# Patient Record
Sex: Female | Born: 2000 | Hispanic: Yes | Marital: Single | Smoking: Never smoker
Health system: Southern US, Community
[De-identification: ages and names within clinical notes are randomized; demographics above are authoritative.]

## PROBLEM LIST (undated history)

## (undated) DIAGNOSIS — J45909 Unspecified asthma, uncomplicated: Secondary | ICD-10-CM

## (undated) HISTORY — PX: APPENDECTOMY: SHX54

## (undated) HISTORY — PX: TONSILLECTOMY: SUR1361

---
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
## 2021-09-12 ENCOUNTER — Other Ambulatory Visit: Payer: Self-pay | Admitting: Internal Medicine

## 2021-09-13 LAB — COMPLETE METABOLIC PANEL WITH GFR
AG Ratio: 1.6 (calc) (ref 1.0–2.5)
ALT: 31 U/L (ref 5–32)
AST: 20 U/L (ref 12–32)
Albumin: 4.4 g/dL (ref 3.6–5.1)
Alkaline phosphatase (APISO): 76 U/L (ref 36–128)
BUN: 10 mg/dL (ref 7–20)
CO2: 24 mmol/L (ref 20–32)
Calcium: 9.5 mg/dL (ref 8.9–10.4)
Chloride: 104 mmol/L (ref 98–110)
Creat: 0.58 mg/dL (ref 0.50–0.96)
Globulin: 2.7 g/dL (calc) (ref 2.0–3.8)
Glucose, Bld: 75 mg/dL (ref 65–99)
Potassium: 3.7 mmol/L — ABNORMAL LOW (ref 3.8–5.1)
Sodium: 139 mmol/L (ref 135–146)
Total Bilirubin: 0.4 mg/dL (ref 0.2–1.1)
Total Protein: 7.1 g/dL (ref 6.3–8.2)
eGFR: 134 mL/min/{1.73_m2} (ref >=60)

## 2021-09-13 LAB — CBC
HCT: 44.4 % (ref 35.0–45.0)
Hemoglobin: 15.3 g/dL (ref 11.7–15.5)
MCH: 29.3 pg (ref 27.0–33.0)
MCHC: 34.5 g/dL (ref 32.0–36.0)
MCV: 84.9 fL (ref 80.0–100.0)
MPV: 9 fL (ref 7.5–12.5)
Platelets: 364 10*3/uL (ref 140–400)
RBC: 5.23 10*6/uL — ABNORMAL HIGH (ref 3.80–5.10)
RDW: 12.7 % (ref 11.0–15.0)
WBC: 8.7 10*3/uL (ref 3.8–10.8)

## 2021-09-13 LAB — LIPID PANEL
Cholesterol: 153 mg/dL (ref <170)
HDL: 37 mg/dL — ABNORMAL LOW (ref >45)
LDL Cholesterol (Calc): 86 mg/dL (calc) (ref <110)
Non-HDL Cholesterol (Calc): 116 mg/dL (calc) (ref <120)
Total CHOL/HDL Ratio: 4.1 (calc) (ref <5.0)
Triglycerides: 199 mg/dL — ABNORMAL HIGH (ref <90)

## 2021-09-13 LAB — TSH: TSH: 2.65 mIU/L

## 2021-09-13 LAB — VITAMIN D 25 HYDROXY (VIT D DEFICIENCY, FRACTURES): Vit D, 25-Hydroxy: 16 ng/mL — ABNORMAL LOW (ref 30–100)

## 2022-11-15 ENCOUNTER — Other Ambulatory Visit: Payer: Self-pay | Admitting: Internal Medicine

## 2022-11-16 LAB — LIPID PANEL
Cholesterol: 159 mg/dL (ref <200)
HDL: 47 mg/dL — ABNORMAL LOW (ref >=50)
LDL Cholesterol (Calc): 94 mg/dL (calc)
Non-HDL Cholesterol (Calc): 112 mg/dL (calc) (ref <130)
Total CHOL/HDL Ratio: 3.4 (calc) (ref <5.0)
Triglycerides: 88 mg/dL (ref <150)

## 2022-11-16 LAB — COMPLETE METABOLIC PANEL WITH GFR
AG Ratio: 1.5 (calc) (ref 1.0–2.5)
ALT: 28 U/L (ref 6–29)
AST: 21 U/L (ref 10–30)
Albumin: 4.6 g/dL (ref 3.6–5.1)
Alkaline phosphatase (APISO): 70 U/L (ref 31–125)
BUN: 8 mg/dL (ref 7–25)
CO2: 25 mmol/L (ref 20–32)
Calcium: 9.8 mg/dL (ref 8.6–10.2)
Chloride: 102 mmol/L (ref 98–110)
Creat: 0.57 mg/dL (ref 0.50–0.96)
Globulin: 3.1 g/dL (calc) (ref 1.9–3.7)
Glucose, Bld: 88 mg/dL (ref 65–99)
Potassium: 4.3 mmol/L (ref 3.5–5.3)
Sodium: 137 mmol/L (ref 135–146)
Total Bilirubin: 0.5 mg/dL (ref 0.2–1.2)
Total Protein: 7.7 g/dL (ref 6.1–8.1)
eGFR: 133 mL/min/{1.73_m2} (ref >=60)

## 2022-11-16 LAB — CBC
HCT: 44.9 % (ref 35.0–45.0)
Hemoglobin: 15.9 g/dL — ABNORMAL HIGH (ref 11.7–15.5)
MCH: 29.7 pg (ref 27.0–33.0)
MCHC: 35.4 g/dL (ref 32.0–36.0)
MCV: 83.8 fL (ref 80.0–100.0)
MPV: 9 fL (ref 7.5–12.5)
Platelets: 381 10*3/uL (ref 140–400)
RBC: 5.36 10*6/uL — ABNORMAL HIGH (ref 3.80–5.10)
RDW: 12.2 % (ref 11.0–15.0)
WBC: 8.7 10*3/uL (ref 3.8–10.8)

## 2022-11-16 LAB — VITAMIN D 25 HYDROXY (VIT D DEFICIENCY, FRACTURES): Vit D, 25-Hydroxy: 11 ng/mL — ABNORMAL LOW (ref 30–100)

## 2023-09-18 ENCOUNTER — Encounter (HOSPITAL_COMMUNITY): Payer: Self-pay

## 2023-09-18 ENCOUNTER — Ambulatory Visit (HOSPITAL_COMMUNITY)
Admission: EM | Admit: 2023-09-18 | Discharge: 2023-09-18 | Disposition: A | Discharge status: Home / Self Care | Payer: Medicaid Other | Attending: Family Medicine | Admitting: Family Medicine

## 2023-09-18 DIAGNOSIS — R1030 Lower abdominal pain, unspecified: Secondary | ICD-10-CM | POA: Diagnosis not present

## 2023-09-18 DIAGNOSIS — K625 Hemorrhage of anus and rectum: Secondary | ICD-10-CM | POA: Diagnosis not present

## 2023-09-18 LAB — CBC
HCT: 42.2 % (ref 36.0–46.0)
Hemoglobin: 15 g/dL (ref 12.0–15.0)
MCH: 29.4 pg (ref 26.0–34.0)
MCHC: 35.5 g/dL (ref 30.0–36.0)
MCV: 82.7 fL (ref 80.0–100.0)
Platelets: 327 10*3/uL (ref 150–400)
RBC: 5.1 MIL/uL (ref 3.87–5.11)
RDW: 11.9 % (ref 11.5–15.5)
WBC: 7.1 10*3/uL (ref 4.0–10.5)
nRBC: 0 % (ref 0.0–0.2)

## 2023-09-18 LAB — COMPREHENSIVE METABOLIC PANEL
ALT: 21 U/L (ref 0–44)
AST: 21 U/L (ref 15–41)
Albumin: 4 g/dL (ref 3.5–5.0)
Alkaline Phosphatase: 89 U/L (ref 38–126)
Anion gap: 5 (ref 5–15)
BUN: 7 mg/dL (ref 6–20)
CO2: 26 mmol/L (ref 22–32)
Calcium: 9.2 mg/dL (ref 8.9–10.3)
Chloride: 104 mmol/L (ref 98–111)
Creatinine, Ser: 0.68 mg/dL (ref 0.44–1.00)
GFR, Estimated: >60 mL/min (ref >60)
Glucose, Bld: 87 mg/dL (ref 70–99)
Potassium: 3.6 mmol/L (ref 3.5–5.1)
Sodium: 135 mmol/L (ref 135–145)
Total Bilirubin: 0.7 mg/dL (ref 0.3–1.2)
Total Protein: 7.6 g/dL (ref 6.5–8.1)

## 2023-09-18 NOTE — Discharge Instructions (Signed)
We have drawn blood to check your blood counts, kidney liver function, and sugar and electrolytes.  Staff will notify if anything is significantly abnormal   If you start feeling dizzy and the bleeding is more and faster, please go to the emergency room for urgent evaluation  Please at least follow-up with your primary care about this issue, to consider possible referral to gastroenterology

## 2023-09-18 NOTE — ED Provider Notes (Signed)
[facility]    CSN: [id number] Arrival date & time: 09/18/23  1619      History   Chief Complaint Chief Complaint  Patient presents with   Abdominal Pain    HPI Katie Mack is a 22 y.o. female.    Abdominal Pain Here for some lower abdominal pain that began in the last few days with some rectal bleeding. She states she has had upper abdominal pain for quite a while and then some lower abdominal pain for the last few days.  Also she has had some light pink color to her stools sometimes for all while, but in the last for 5 days she has had streaks of blood in her stool.  No fever or vomiting   She did have diarrhea this morning but has had some constipation in the past  She is seeing her primary care about upper abdominal pain and ultrasound just showed fatty liver   last menstrual cycle was October 8.  She mentions feeling some pressure around her rectum   Past Medical History:  Diagnosis Date   Asthma     Patient Active Problem List   Diagnosis Date Noted   GERD 02/02/2015   Vitamin D deficiency 07/15/2014   Wears glasses 07/15/2014   Allergic rhinitis 07/15/2014   Mild intermittent asthma 07/15/2014   History of self injurious behavior 03/02/2014   Adjustment disorder with depressed mood 03/02/2014   Pediatric body mass index (BMI) of 85th percentile to less than 95th percentile for age 97/04/2014    Past Surgical History:  Procedure Laterality Date   TONSILLECTOMY      OB History   No obstetric history on file.      Home Medications    Prior to Admission medications   Medication Sig Start Date End Date Taking? Authorizing Provider  cetirizine (ZYRTEC) 10 MG tablet Take 1 tablet (10 mg total) by mouth daily. 10/10/18  Yes Jonetta Osgood, MD  fluticasone (FLOVENT HFA) 110 MCG/ACT inhaler Inhale 2 puffs into the lungs 2 (two) times daily. 10/10/18  Yes Jonetta Osgood, MD  VENTOLIN HFA 108 [phone] Base) MCG/ACT inhaler SMARTSIG:2  Puff(s) By Mouth 4 Times Daily PRN 05/03/23  Yes [provider]  fluticasone (FLONASE) 50 MCG/ACT nasal spray Place 1 spray into both nostrils daily. 1 spray in each nostril every day 10/10/18   Jonetta Osgood, MD    Family History History reviewed. No pertinent family history.  Social History Social History   Tobacco Use   Smoking status: Never  Vaping Use   Vaping status: Former  Substance Use Topics   Alcohol use: Yes     Allergies   Patient has no known allergies.   Review of Systems Review of Systems  Gastrointestinal:  Positive for abdominal pain.     Physical Exam Triage Vital Signs ED Triage Vitals  Encounter Vitals Group     BP 09/18/23 1657 123/80     Systolic BP Percentile --      Diastolic BP Percentile --      Pulse Rate 09/18/23 1657 85     Resp 09/18/23 1657 18     Temp 09/18/23 1657 98.4 F (36.9 C)     Temp Source 09/18/23 1657 Oral     SpO2 09/18/23 1657 98 %     Weight --      Height --      Head Circumference --      Peak Flow --  Pain Score 09/18/23 1658 2     Pain Loc --      Pain Education --      Exclude from Growth Chart --    No data found.  Updated Vital Signs BP 123/80 (BP Location: Left Arm)   Pulse 85   Temp 98.4 F (36.9 C) (Oral)   Resp 18   LMP 09/04/2023 (Approximate)   SpO2 98%   Visual Acuity Right Eye Distance:   Left Eye Distance:   Bilateral Distance:    Right Eye Near:   Left Eye Near:    Bilateral Near:     Physical Exam Vitals reviewed.  Constitutional:      General: She is not in acute distress.    Appearance: She is not ill-appearing, toxic-appearing or diaphoretic.  HENT:     Mouth/Throat:     Mouth: Mucous membranes are moist.  Eyes:     Extraocular Movements: Extraocular movements intact.     Conjunctiva/sclera: Conjunctivae normal.     Pupils: Pupils are equal, round, and reactive to light.  Cardiovascular:     Rate and Rhythm: Normal rate and regular rhythm.     Heart  sounds: No murmur heard. Pulmonary:     Effort: Pulmonary effort is normal.     Breath sounds: Normal breath sounds.  Abdominal:     General: There is no distension.     Palpations: Abdomen is soft. There is no mass.     Tenderness: There is no guarding.     Comments: There is some tenderness of the upper right quadrant and epigastric area and in both lower quadrants.  Abdomen is soft and bowel sounds are normal  Genitourinary:    Comments: Chaperone is present during exam.  There are no external hemorrhoids or masses or redness around the rectum. Musculoskeletal:     Cervical back: Neck supple.  Lymphadenopathy:     Cervical: No cervical adenopathy.  Skin:    Coloration: Skin is not jaundiced or pale.  Neurological:     General: No focal deficit present.     Mental Status: She is alert and oriented to person, place, and time.  Psychiatric:        Behavior: Behavior normal.      UC Treatments / Results  Labs (all labs ordered are listed, but only abnormal results are displayed) Labs Reviewed  CBC  COMPREHENSIVE METABOLIC PANEL    EKG   Radiology No results found.  Procedures Procedures (including critical care time)  Medications Ordered in UC Medications - No data to display  Initial Impression / Assessment and Plan / UC Course  I have reviewed the triage vital signs and the nursing notes.  Pertinent labs & imaging results that were available during my care of the patient were reviewed by me and considered in my medical decision making (see chart for details).      CBC and CMP are drawn today.  Will notify her if anything is significantly abnormal.  I have encouraged her to follow-up with her primary care.  Final Clinical Impressions(s) / UC Diagnoses   Final diagnoses:  Rectal bleeding  Lower abdominal pain     Discharge Instructions      We have drawn blood to check your blood counts, kidney liver function, and sugar and electrolytes.  Staff will  notify if anything is significantly abnormal   If you start feeling dizzy and the bleeding is more and faster, please go to the emergency room for  urgent evaluation  Please at least follow-up with your primary care about this issue, to consider possible referral to gastroenterology    ED Prescriptions   None    PDMP not reviewed this encounter.   Zenia Resides, MD 09/18/23 [phone]

## 2023-09-18 NOTE — ED Triage Notes (Signed)
Pt presents with lower abdominal pain. Pt states " I have been pooping bright red blood for the last 4 days and I have been extremely nauseous." Pt's last BM was this morning, noted blood in the stool. Pt also reporting fatigue. Pt states she has not taken any medication for her symptoms.

## 2023-09-29 ENCOUNTER — Ambulatory Visit (HOSPITAL_COMMUNITY): Payer: Medicaid Other

## 2024-06-07 ENCOUNTER — Ambulatory Visit (HOSPITAL_COMMUNITY)

## 2024-07-24 ENCOUNTER — Encounter (HOSPITAL_COMMUNITY): Payer: Self-pay

## 2024-07-24 ENCOUNTER — Ambulatory Visit (HOSPITAL_COMMUNITY)
Admission: EM | Admit: 2024-07-24 | Discharge: 2024-07-24 | Disposition: A | Discharge status: Home / Self Care | Attending: Family Medicine | Admitting: Family Medicine

## 2024-07-24 DIAGNOSIS — R8281 Pyuria: Secondary | ICD-10-CM | POA: Diagnosis present

## 2024-07-24 DIAGNOSIS — N898 Other specified noninflammatory disorders of vagina: Secondary | ICD-10-CM | POA: Insufficient documentation

## 2024-07-24 DIAGNOSIS — Z113 Encounter for screening for infections with a predominantly sexual mode of transmission: Secondary | ICD-10-CM | POA: Insufficient documentation

## 2024-07-24 LAB — HIV ANTIBODY (ROUTINE TESTING W REFLEX): HIV Screen 4th Generation wRfx: NONREACTIVE

## 2024-07-24 LAB — POCT URINALYSIS DIP (MANUAL ENTRY)
Blood, UA: NEGATIVE
Glucose, UA: NEGATIVE mg/dL
Ketones, POC UA: NEGATIVE mg/dL
Nitrite, UA: NEGATIVE
Protein Ur, POC: NEGATIVE mg/dL
Spec Grav, UA: 1.025 (ref 1.010–1.025)
Urobilinogen, UA: 1 U/dL
pH, UA: 6.5 (ref 5.0–8.0)

## 2024-07-24 LAB — POCT URINE PREGNANCY: Preg Test, Ur: NEGATIVE

## 2024-07-24 MED ORDER — FLUCONAZOLE 150 MG PO TABS
150.0000 mg | ORAL_TABLET | ORAL | 0 refills | Status: AC
Start: 2024-07-24 — End: 2024-07-28

## 2024-07-24 MED ORDER — METRONIDAZOLE 500 MG PO TABS: 500.0000 mg | ORAL_TABLET | Freq: Two times a day (BID) | ORAL | 0 refills | Status: AC

## 2024-07-24 NOTE — Discharge Instructions (Addendum)
 Testing for bacteria, yeast, gonorrhea, chlamydia, trichomonas, HIV and syphilis is currently pending.  A urine culture has also been sent to identify if you have any bacteria in your urine which could be causing your symptoms as well. It is important that you avoid any sexual activity until your test results have returned, your treatment is complete, and your symptoms have fully resolved.  Because you are experiencing symptoms today, you have been prescribed oral antibiotics to treat a possible bacterial vaginal infection and an antifungal medication for possible yeast infection. It is very important to take all of the medication exactly as prescribed, even if you begin to feel better before finishing the course.  You will only be contacted if any of your test results come back positive. However, you can view your results at any time by logging into your MyChart account.

## 2024-07-24 NOTE — ED Triage Notes (Addendum)
 Patient is requesting STD testing. Patient states she is having abdominal cramping and having a white  vaginal discharge x 1 1/2 weeks. Patient is also requesting blood work as well.

## 2024-07-24 NOTE — ED Provider Notes (Signed)
 [facility]    CSN: [id number] Arrival date & time: 07/24/24  1102      History   Chief Complaint No chief complaint on file.   HPI Katie Mack is a 23 y.o. female.   Discussed the use of AI scribe software for clinical note transcription with the patient, who gave verbal consent to proceed.   The patient presents for STD testing. She reports a history of chlamydia several years ago and expresses concern about her current symptoms.  The patient describes experiencing white vaginal discharge, which she is unsure if it's normal. She states the discharge does not have a strong odor. Additionally, she reports cramping in her lower abdomen, which she typically experiences before her menstrual period. The patient denies any burning or pain during urination, itching or irritation of the vagina, genital sores or bumps, or oral sores. She also denies nausea, vomiting, back pain, or fevers. The patient's last menstrual period was approximately 5 weeks ago. She is currently not on birth control and is sexually active. She reports having two female sexual partners in the past 3 months and sometimes uses condoms during intercourse.  The following portions of the patient's history were reviewed and updated as appropriate: allergies, current medications, past family history, past medical history, past social history, past surgical history, and problem list.    Past Medical History:  Diagnosis Date   Asthma     Patient Active Problem List   Diagnosis Date Noted   GERD 02/02/2015   Vitamin D  deficiency 07/15/2014   Wears glasses 07/15/2014   Allergic rhinitis 07/15/2014   Mild intermittent asthma 07/15/2014   History of self injurious behavior 03/02/2014   Adjustment disorder with depressed mood 03/02/2014   Pediatric body mass index (BMI) of 85th percentile to less than 95th percentile for age 33/04/2014    Past Surgical History:  Procedure Laterality Date    TONSILLECTOMY      OB History   No obstetric history on file.      Home Medications    Prior to Admission medications   Medication Sig Start Date End Date Taking? Authorizing Provider  fluconazole  (DIFLUCAN ) 150 MG tablet Take 1 tablet (150 mg total) by mouth every 3 (three) days for 2 doses. 07/24/24 07/28/24 Yes Iola Lukes, FNP  metroNIDAZOLE  (FLAGYL ) 500 MG tablet Take 1 tablet (500 mg total) by mouth 2 (two) times daily for 7 days. 07/24/24 07/31/24 Yes Iola Lukes, FNP  cetirizine  (ZYRTEC ) 10 MG tablet Take 1 tablet (10 mg total) by mouth daily. 10/10/18   Delores Clapper, MD  fluticasone  (FLONASE ) 50 MCG/ACT nasal spray Place 1 spray into both nostrils daily. 1 spray in each nostril every day 10/10/18   Delores Clapper, MD  fluticasone  (FLOVENT  HFA) 110 MCG/ACT inhaler Inhale 2 puffs into the lungs 2 (two) times daily. 10/10/18   Delores Clapper, MD  VENTOLIN  HFA 108 [phone] Base) MCG/ACT inhaler SMARTSIG:2 Puff(s) By Mouth 4 Times Daily PRN 05/03/23   [provider]    Family History History reviewed. No pertinent family history.  Social History Social History   Tobacco Use   Smoking status: Never  Vaping Use   Vaping status: Former  Substance Use Topics   Alcohol use: Yes   Drug use: Never     Allergies   Patient has no known allergies.   Review of Systems Review of Systems  Constitutional:  Negative for fever.  HENT:  Negative for mouth sores.   Gastrointestinal:  Negative  for abdominal pain (mild lower abdominal cramping), nausea and vomiting.  Genitourinary:  Positive for vaginal discharge (white, mild odor). Negative for dysuria, genital sores and menstrual problem (LMP about 5 weeks ago).       Mildly sensitive to vaginal area but no irritation or itching   Musculoskeletal:  Negative for back pain.  All other systems reviewed and are negative.    Physical Exam Triage Vital Signs ED Triage Vitals  Encounter Vitals Group     BP 07/24/24 1118  124/84     Girls Systolic BP Percentile --      Girls Diastolic BP Percentile --      Boys Systolic BP Percentile --      Boys Diastolic BP Percentile --      Pulse Rate 07/24/24 1118 75     Resp 07/24/24 1118 14     Temp 07/24/24 1118 98.6 F (37 C)     Temp Source 07/24/24 1118 Oral     SpO2 07/24/24 1118 100 %     Weight --      Height --      Head Circumference --      Peak Flow --      Pain Score 07/24/24 1117 3     Pain Loc --      Pain Education --      Exclude from Growth Chart --    No data found.  Updated Vital Signs BP 124/84 (BP Location: Right Arm)   Pulse 75   Temp 98.6 F (37 C) (Oral)   Resp 14   LMP 06/27/2024 (Approximate)   SpO2 100%   Visual Acuity Right Eye Distance:   Left Eye Distance:   Bilateral Distance:    Right Eye Near:   Left Eye Near:    Bilateral Near:     Physical Exam Constitutional:      General: She is not in acute distress.    Appearance: Normal appearance. She is not ill-appearing, toxic-appearing or diaphoretic.  HENT:     Head: Normocephalic.     Nose: Nose normal.     Mouth/Throat:     Mouth: Mucous membranes are moist.  Eyes:     Conjunctiva/sclera: Conjunctivae normal.  Cardiovascular:     Rate and Rhythm: Normal rate.  Pulmonary:     Effort: Pulmonary effort is normal.  Abdominal:     Palpations: Abdomen is soft.  Genitourinary:    Comments: Deferred; patient performed self-swab for Aptima testing  Musculoskeletal:        General: Normal range of motion.     Cervical back: Normal range of motion and neck supple.  Skin:    General: Skin is warm and dry.  Neurological:     General: No focal deficit present.     Mental Status: She is alert and oriented to person, place, and time.  Psychiatric:        Mood and Affect: Mood normal.        Behavior: Behavior normal.      UC Treatments / Results  Labs (all labs ordered are listed, but only abnormal results are displayed) Labs Reviewed  POCT URINALYSIS  DIP (MANUAL ENTRY) - Abnormal; Notable for the following components:      Result Value   Color, UA straw (*)    Clarity, UA cloudy (*)    Bilirubin, UA small (*)    Leukocytes, UA Trace (*)    All other components within normal limits  URINE CULTURE  RPR  HIV ANTIBODY (ROUTINE TESTING W REFLEX)  POCT URINE PREGNANCY  CERVICOVAGINAL ANCILLARY ONLY    EKG   Radiology No results found.  Procedures Procedures (including critical care time)  Medications Ordered in UC Medications - No data to display  Initial Impression / Assessment and Plan / UC Course  I have reviewed the triage vital signs and the nursing notes.  Pertinent labs & imaging results that were available during my care of the patient were reviewed by me and considered in my medical decision making (see chart for details).     Patient presents for STI testing due to symptoms of lower abdominal cramping and vaginal discharge.  Urinalysis shows straw-colored urine that is cloudy with trace leukocytes.  She denies any dysuria.  Urine pregnancy negative.  Tests obtained today include gonorrhea, chlamydia, trichomonas, HIV and syphilis.  Urine culture was also sent out.  Will await results to determine if any treatment is needed. Patient was counseled to abstain from sexual activity until all results have been received, any necessary treatment has been completed, and any symptoms, if present, have resolved. Safe sex practices were discussed and encouraged, including consistent condom use and regular screening with new partners. Patient advised they will only be contacted if any results are positive or require follow-up; otherwise, they may review their results through MyChart.  Today's evaluation has revealed no signs of a dangerous process. Discussed diagnosis with patient and/or guardian. Patient and/or guardian aware of their diagnosis, possible red flag symptoms to watch out for and need for close follow up. Patient and/or  guardian understands verbal and written discharge instructions. Patient and/or guardian comfortable with plan and disposition.  Patient and/or guardian has a clear mental status at this time, good insight into illness (after discussion and teaching) and has clear judgment to make decisions regarding their care  Documentation was completed with the aid of voice recognition software. Transcription may contain typographical errors. Final Clinical Impressions(s) / UC Diagnoses   Final diagnoses:  Vaginal discharge  Pyuria  Screening for STD (sexually transmitted disease)     Discharge Instructions      Testing for bacteria, yeast, gonorrhea, chlamydia, trichomonas, HIV and syphilis is currently pending.  A urine culture has also been sent to identify if you have any bacteria in your urine which could be causing your symptoms as well. It is important that you avoid any sexual activity until your test results have returned, your treatment is complete, and your symptoms have fully resolved.  Because you are experiencing symptoms today, you have been prescribed oral antibiotics to treat a possible bacterial vaginal infection and an antifungal medication for possible yeast infection. It is very important to take all of the medication exactly as prescribed, even if you begin to feel better before finishing the course.  You will only be contacted if any of your test results come back positive. However, you can view your results at any time by logging into your MyChart account.     ED Prescriptions     Medication Sig Dispense Auth. Provider   metroNIDAZOLE  (FLAGYL ) 500 MG tablet Take 1 tablet (500 mg total) by mouth 2 (two) times daily for 7 days. 14 tablet Aydee Mcnew, [place], FNP   fluconazole  (DIFLUCAN ) 150 MG tablet Take 1 tablet (150 mg total) by mouth every 3 (three) days for 2 doses. 2 tablet Iola Lukes, FNP      PDMP not reviewed this encounter.   Iola Lukes, [place] 07/24/24  1228

## 2024-07-25 ENCOUNTER — Ambulatory Visit (HOSPITAL_COMMUNITY): Payer: Self-pay

## 2024-07-25 LAB — URINE CULTURE

## 2024-07-25 LAB — CERVICOVAGINAL ANCILLARY ONLY
Bacterial Vaginitis (gardnerella): POSITIVE — AB
Candida Glabrata: NEGATIVE
Candida Vaginitis: NEGATIVE
Chlamydia: NEGATIVE
Comment: NEGATIVE
Comment: NEGATIVE
Comment: NEGATIVE
Comment: NEGATIVE
Comment: NEGATIVE
Comment: NORMAL
Neisseria Gonorrhea: NEGATIVE
Trichomonas: NEGATIVE

## 2024-07-25 LAB — RPR: RPR Ser Ql: NONREACTIVE

## 2024-10-01 ENCOUNTER — Encounter (HOSPITAL_COMMUNITY): Payer: Self-pay | Admitting: Emergency Medicine

## 2024-10-01 ENCOUNTER — Ambulatory Visit (HOSPITAL_COMMUNITY)

## 2024-10-01 ENCOUNTER — Ambulatory Visit (HOSPITAL_COMMUNITY)
Admission: EM | Admit: 2024-10-01 | Discharge: 2024-10-01 | Disposition: A | Discharge status: Home / Self Care | Attending: Physician Assistant | Admitting: Physician Assistant

## 2024-10-01 DIAGNOSIS — L292 Pruritus vulvae: Secondary | ICD-10-CM | POA: Insufficient documentation

## 2024-10-01 MED ORDER — FLUCONAZOLE 150 MG PO TABS: 150.0000 mg | ORAL_TABLET | ORAL | 0 refills | Status: DC | PRN

## 2024-10-01 NOTE — ED Triage Notes (Signed)
 Pt reports that since Sunday/Monday had vaginal itching. Denies any discharge or bumps, sores, lesions. Denies any known exposure to STD. Reports started her menstrual cycle today.

## 2024-10-01 NOTE — Discharge Instructions (Addendum)
 VISIT SUMMARY:  You came in today because you have been experiencing itching in your genital area since Monday. You mentioned that this itching started around the same time as your menstrual period, but you have not noticed any changes in your vaginal discharge or other symptoms like pain during urination, fever, or rashes. You took a Diflucan  pill on Monday, which you had leftover from a previous treatment for a yeast infection.  YOUR PLAN:  -VULVOVAGINAL CANDIDIASIS (YEAST INFECTION): A yeast infection is caused by an overgrowth of yeast in the vaginal area, leading to itching and discomfort. You have been prescribed Diflucan  (fluconazole ) 150 mg to be taken orally every 72 hours for two doses. We have also taken a swab test to confirm the diagnosis and check for other possible infections. The results of the swab test will be available in 1-3 days, and we will call you if the results are positive.  INSTRUCTIONS:  Please take the prescribed Diflucan  as directed. Await the results of the swab test, which will be communicated to you within 1-3 days if positive. If your symptoms persist or worsen, please contact our office.

## 2024-10-01 NOTE — ED Provider Notes (Signed)
 [facility]    CSN: [id number] Arrival date & time: 10/01/24  9060      History   Chief Complaint Chief Complaint  Patient presents with   appt 930    HPI Katie Mack is a 23 y.o. female.  has a past medical history of Asthma.   HPI  Discussed the use of AI scribe software for clinical note transcription with the patient, who gave verbal consent to proceed.  The patient presents with genital itching.  She has been experiencing itching in the genital area since Monday, which she describes as unusual. No changes in vaginal discharge, but she notes the onset of her menstrual period, associated with her typical cramping and pain.  No pain during urination, fever, chills, sores, rashes, vaginal pain, pelvic pain, nausea, vomiting, or diarrhea. She has not experienced these symptoms previously in the past.   She took a leftover Diflucan  pill on Monday, which she had from a previous episode when she was treated for bacterial vaginosis and also received medication for a yeast infection.   Past Medical History:  Diagnosis Date   Asthma     Patient Active Problem List   Diagnosis Date Noted   GERD 02/02/2015   Vitamin D  deficiency 07/15/2014   Wears glasses 07/15/2014   Allergic rhinitis 07/15/2014   Mild intermittent asthma 07/15/2014   History of self injurious behavior 03/02/2014   Adjustment disorder with depressed mood 03/02/2014   Pediatric body mass index (BMI) of 85th percentile to less than 95th percentile for age 16/04/2014    Past Surgical History:  Procedure Laterality Date   TONSILLECTOMY      OB History   No obstetric history on file.      Home Medications    Prior to Admission medications   Medication Sig Start Date End Date Taking? Authorizing Provider  fluconazole  (DIFLUCAN ) 150 MG tablet Take 1 tablet (150 mg total) by mouth every three (3) days as needed. May repeat in 3 days if symptoms not resolved 10/01/24  Yes Kellianne Ek,  [place] Hawker E, PA-C    Family History No family history on file.  Social History Social History   Tobacco Use   Smoking status: Never  Vaping Use   Vaping status: Former  Substance Use Topics   Alcohol use: Yes   Drug use: Never     Allergies   Patient has no known allergies.   Review of Systems Review of Systems  Constitutional:  Negative for chills and fever.  Gastrointestinal:  Positive for abdominal pain (attributed to menstrual cramping). Negative for diarrhea, nausea and vomiting.  Genitourinary:  Negative for dysuria, genital sores, menstrual problem, pelvic pain and vaginal pain.       Vulvovaginal itching and discomfort   Skin:  Negative for rash.     Physical Exam Triage Vital Signs ED Triage Vitals  Encounter Vitals Group     BP 10/01/24 0959 115/65     Girls Systolic BP Percentile --      Girls Diastolic BP Percentile --      Boys Systolic BP Percentile --      Boys Diastolic BP Percentile --      Pulse Rate 10/01/24 0959 78     Resp 10/01/24 0959 17     Temp 10/01/24 0959 97.9 F (36.6 C)     Temp Source 10/01/24 0959 Oral     SpO2 10/01/24 0959 97 %     Weight --  Height --      Head Circumference --      Peak Flow --      Pain Score 10/01/24 0958 0     Pain Loc --      Pain Education --      Exclude from Growth Chart --    No data found.  Updated Vital Signs BP 115/65 (BP Location: Right Arm)   Pulse 78   Temp 97.9 F (36.6 C) (Oral)   Resp 17   LMP 10/01/2024 (Exact Date)   SpO2 97%   Visual Acuity Right Eye Distance:   Left Eye Distance:   Bilateral Distance:    Right Eye Near:   Left Eye Near:    Bilateral Near:     Physical Exam Vitals reviewed.  Constitutional:      General: She is awake.     Appearance: Normal appearance. She is well-developed and well-groomed.  HENT:     Head: Normocephalic and atraumatic.  Eyes:     General: Lids are normal. Gaze aligned appropriately.     Extraocular Movements: Extraocular  movements intact.     Conjunctiva/sclera: Conjunctivae normal.  Pulmonary:     Effort: Pulmonary effort is normal.  Neurological:     Mental Status: She is alert and oriented to person, place, and time.  Psychiatric:        Attention and Perception: Attention and perception normal.        Mood and Affect: Mood and affect normal.        Speech: Speech normal.        Behavior: Behavior normal. Behavior is cooperative.      UC Treatments / Results  Labs (all labs ordered are listed, but only abnormal results are displayed) Labs Reviewed  CERVICOVAGINAL ANCILLARY ONLY    EKG   Radiology No results found.  Procedures Procedures (including critical care time)  Medications Ordered in UC Medications - No data to display  Initial Impression / Assessment and Plan / UC Course  I have reviewed the triage vital signs and the nursing notes.  Pertinent labs & imaging results that were available during my care of the patient were reviewed by me and considered in my medical decision making (see chart for details).      Final Clinical Impressions(s) / UC Diagnoses   Final diagnoses:  Vulvovaginal itching   Vulvovaginal candidiasis (yeast infection) Intermittent genital itching since Monday, localized to the genital area. No vaginal discharge changes, lower abdominal pain, dysuria, fever, chills, sores, rashes, vaginal pain, pelvic pain, nausea, vomiting, or diarrhea. No similar symptoms previously. Differential diagnosis includes bacterial vaginosis, trichomonas, gonorrhea, and chlamydia. Swab test performed to check for yeast, bacterial vaginosis, trichomonas, gonorrhea, and chlamydia. Blood work for HIV and syphilis not desired as recent tests were done. Previous yeast infection prophylaxis with Diflucan  when treated for BV. Current symptoms suggestive of yeast infection. - Prescribed Diflucan  (fluconazole ) 150 mg orally every 72 hours for two doses. - Await swab test results to  confirm diagnosis and adjust treatment if necessary. - Informed that swab results may take 1-3 days and positive results will be communicated via phone call.    Discharge Instructions      VISIT SUMMARY:  You came in today because you have been experiencing itching in your genital area since Monday. You mentioned that this itching started around the same time as your menstrual period, but you have not noticed any changes in your vaginal discharge or other symptoms like  pain during urination, fever, or rashes. You took a Diflucan  pill on Monday, which you had leftover from a previous treatment for a yeast infection.  YOUR PLAN:  -VULVOVAGINAL CANDIDIASIS (YEAST INFECTION): A yeast infection is caused by an overgrowth of yeast in the vaginal area, leading to itching and discomfort. You have been prescribed Diflucan  (fluconazole ) 150 mg to be taken orally every 72 hours for two doses. We have also taken a swab test to confirm the diagnosis and check for other possible infections. The results of the swab test will be available in 1-3 days, and we will call you if the results are positive.  INSTRUCTIONS:  Please take the prescribed Diflucan  as directed. Await the results of the swab test, which will be communicated to you within 1-3 days if positive. If your symptoms persist or worsen, please contact our office.     ED Prescriptions     Medication Sig Dispense Auth. Provider   fluconazole  (DIFLUCAN ) 150 MG tablet Take 1 tablet (150 mg total) by mouth every three (3) days as needed. May repeat in 3 days if symptoms not resolved 2 tablet Yeimy Brabant E, PA-C      PDMP not reviewed this encounter.   Marylene Rocky BRAVO, PA-C 10/01/24 1035

## 2024-10-02 LAB — CERVICOVAGINAL ANCILLARY ONLY
Bacterial Vaginitis (gardnerella): NEGATIVE
Candida Glabrata: NEGATIVE
Candida Vaginitis: NEGATIVE
Chlamydia: POSITIVE — AB
Comment: NEGATIVE
Comment: NEGATIVE
Comment: NEGATIVE
Comment: NEGATIVE
Comment: NEGATIVE
Comment: NORMAL
Neisseria Gonorrhea: NEGATIVE
Trichomonas: NEGATIVE

## 2024-10-03 ENCOUNTER — Ambulatory Visit (HOSPITAL_COMMUNITY): Payer: Self-pay

## 2024-10-03 MED ORDER — DOXYCYCLINE HYCLATE 100 MG PO TABS: 100.0000 mg | ORAL_TABLET | Freq: Two times a day (BID) | ORAL | 0 refills | Status: AC

## 2024-10-21 ENCOUNTER — Telehealth: Admitting: Physician Assistant

## 2024-10-21 DIAGNOSIS — R197 Diarrhea, unspecified: Secondary | ICD-10-CM

## 2024-10-22 NOTE — Progress Notes (Signed)
  Because of how long symptoms have been present and need for examination and possible testing, I feel your condition warrants further evaluation and I recommend that you be seen in a face-to-face visit.   NOTE: There will be NO CHARGE for this E-Visit   If you are having a true medical emergency, please call 911.     For an urgent face to face visit, [facility] has multiple urgent care centers for your convenience.  Click the link below for the full list of locations and hours, walk-in wait times, appointment scheduling options and driving directions:  Urgent Care - [place], [place], [place], [place], [place], [place]  [place]     Your MyChart E-visit questionnaire answers were reviewed by a board certified advanced clinical practitioner to complete your personal care plan based on your specific symptoms.    Thank you for using e-Visits.

## 2024-10-28 ENCOUNTER — Other Ambulatory Visit: Payer: Self-pay

## 2024-10-28 ENCOUNTER — Ambulatory Visit: Admitting: Obstetrics and Gynecology

## 2024-10-28 ENCOUNTER — Encounter: Payer: Self-pay | Admitting: Obstetrics and Gynecology

## 2024-10-28 ENCOUNTER — Other Ambulatory Visit (HOSPITAL_COMMUNITY)
Admission: RE | Admit: 2024-10-28 | Discharge: 2024-10-28 | Disposition: A | Discharge status: Home / Self Care | Source: Ambulatory Visit | Attending: Obstetrics and Gynecology | Admitting: Obstetrics and Gynecology

## 2024-10-28 VITALS — BP 111/68 | HR 86 | Wt 174.4 lb

## 2024-10-28 DIAGNOSIS — N939 Abnormal uterine and vaginal bleeding, unspecified: Secondary | ICD-10-CM | POA: Diagnosis not present

## 2024-10-28 DIAGNOSIS — Z01419 Encounter for gynecological examination (general) (routine) without abnormal findings: Secondary | ICD-10-CM | POA: Diagnosis not present

## 2024-10-28 DIAGNOSIS — Z124 Encounter for screening for malignant neoplasm of cervix: Secondary | ICD-10-CM

## 2024-10-28 DIAGNOSIS — Z1239 Encounter for other screening for malignant neoplasm of breast: Secondary | ICD-10-CM

## 2024-10-28 LAB — POCT PREGNANCY, URINE: Preg Test, Ur: NEGATIVE

## 2024-10-28 NOTE — Progress Notes (Signed)
 GYNECOLOGY ANNUAL PREVENTATIVE CARE ENCOUNTER NOTE  Subjective:   Katie Mack is a 23 y.o. G0P0000 female here for a annual gynecologic exam. Current complaints: needs pap, irregular periods.    Reports a week before her period stars, she gets strong cramps. Sometimes starts as spotting, sometimes heavy, heavy for a few days, then lightens up, lasts about 7 days. Cycles last 4-6 weeks. More often it is 5-6 weeks long than 4 weeks.   Denies abnormal vaginal bleeding, discharge, pelvic pain, problems with intercourse or other gynecologic concerns. Declines STI screen, had done about a month ago.   Has had breast tenderness and feeling slightly different this month.  Positive for CT 09/29/24, treated.   Gynecologic History Patient's last menstrual period was 10/01/2024 (exact date). Contraception: none Last Pap: n/a Last mammogram: n/a Gardisil: has received  Obstetric History OB History  Gravida Para Term Preterm AB Living  0 0 0 0 0 0  SAB IAB Ectopic Multiple Live Births  0 0 0 0 0    Past Medical History:  Diagnosis Date   Asthma     Past Surgical History:  Procedure Laterality Date   APPENDECTOMY     TONSILLECTOMY      Current Outpatient Medications on File Prior to Visit  Medication Sig Dispense Refill   albuterol  (VENTOLIN  HFA) 108 (90 Base) MCG/ACT inhaler 1 puff as needed Inhalation every 4 hrs     fluconazole  (DIFLUCAN ) 150 MG tablet Take 1 tablet (150 mg total) by mouth every three (3) days as needed. May repeat in 3 days if symptoms not resolved (Patient not taking: Reported on 10/28/2024) 2 tablet 0   No current facility-administered medications on file prior to visit.    No Known Allergies  Social History   Socioeconomic History   Marital status: Single    Spouse name: Not on file   Number of children: Not on file   Years of education: Not on file   Highest education level: Not on file  Occupational History   Not on file  Tobacco  Use   Smoking status: Never   Smokeless tobacco: Not on file  Vaping Use   Vaping status: Former  Substance and Sexual Activity   Alcohol use: Yes   Drug use: Never   Sexual activity: Yes    Birth control/protection: None  Other Topics Concern   Not on file  Social History Narrative   Not on file   Social Drivers of Health   Financial Resource Strain: Not on file  Food Insecurity: Not on file  Transportation Needs: Not on file  Physical Activity: Not on file  Stress: Not on file  Social Connections: Not on file  Intimate Partner Violence: Not on file   History reviewed. No pertinent family history.  The following portions of the patient's history were reviewed and updated as appropriate: allergies, current medications, past family history, past medical history, past social history, past surgical history and problem list.  Review of Systems Pertinent items are noted in HPI.   Objective:  BP 111/68   Pulse 86   Wt 174 lb 6.4 oz (79.1 kg)   LMP 10/01/2024 (Exact Date)  CONSTITUTIONAL: Well-developed, well-nourished female in no acute distress.  HENT:  Normocephalic, atraumatic, External right and left ear normal. Oropharynx is clear and moist EYES: Conjunctivae and EOM are normal. Pupils are equal, round, and reactive to light. No scleral icterus.  NECK: Normal range of motion, supple, no masses.  Normal  thyroid.  SKIN: Skin is warm and dry. No rash noted. Not diaphoretic. No erythema. No pallor. NEUROLOGIC: Alert and oriented to person, place, and time. Normal reflexes, muscle tone coordination. No cranial nerve deficit noted. PSYCHIATRIC: Normal mood and affect. Normal behavior. Normal judgment and thought content. CARDIOVASCULAR: Normal heart rate noted RESPIRATORY: Effort normal, no problems with respiration noted. BREASTS: Symmetric in size. No masses, skin changes, nipple drainage, or lymphadenopathy. ABDOMEN: Soft, no distention noted.  No tenderness, rebound or  guarding.  PELVIC: Normal appearing external genitalia; normal appearing vaginal mucosa and cervix.  No abnormal discharge noted.  Pap smear obtained. Normal uterine size, no other palpable masses, no uterine or adnexal tenderness. MUSCULOSKELETAL: Normal range of motion. No tenderness.  No cyanosis, clubbing, or edema.   UPT:  Exam done with chaperone present.  Assessment and Plan:   1. Well woman exam (Primary) Normal female exam  2. Cervical cancer screening - Cytology - PAP( [facility])  3. Encounter for screening for malignant neoplasm of breast, unspecified screening modality Breast exam today  4. Abnormal uterine bleeding (AUB) Abnormally long cycles, though occurring regularly Has never had workup Was having unprotected intercourse for years with no pregnancy Will get basic labs and f/u for further workup - Hemoglobin A1c - Beta hCG quant (ref lab) - Follicle stimulating hormone - Prolactin - Testosterone,Free and Total - TSH Rfx on Abnormal to Free T4   Will follow up results of pap smear and manage accordingly. Encouraged improvement in diet and exercise.    Routine preventative health maintenance measures emphasized. Please refer to After Visit Summary for other counseling recommendations.     LOIS Yolanda Moats, MD, [facility] Attending Center for [employer] [facility])

## 2024-10-29 LAB — HEMOGLOBIN A1C
Est. average glucose Bld gHb Est-mCnc: 97 mg/dL
Hgb A1c MFr Bld: 5 % (ref 4.8–5.6)

## 2024-10-29 LAB — PROLACTIN: Prolactin: 20.1 ng/mL (ref 4.8–33.4)

## 2024-10-29 LAB — FOLLICLE STIMULATING HORMONE: FSH: 4.8 m[IU]/mL

## 2024-10-29 LAB — TESTOSTERONE,FREE AND TOTAL
Testosterone, Free: 4.4 pg/mL — ABNORMAL HIGH (ref 0.0–4.2)
Testosterone: 52 ng/dL (ref 13–71)

## 2024-10-29 LAB — TSH RFX ON ABNORMAL TO FREE T4: TSH: 3.42 u[IU]/mL (ref 0.450–4.500)

## 2024-10-29 LAB — CYTOLOGY - PAP: Diagnosis: NEGATIVE

## 2024-10-29 LAB — BETA HCG QUANT (REF LAB): hCG Quant: <1 m[IU]/mL

## 2024-10-30 ENCOUNTER — Ambulatory Visit: Payer: Self-pay | Admitting: Obstetrics and Gynecology

## 2024-12-02 ENCOUNTER — Ambulatory Visit (HOSPITAL_COMMUNITY)
Admission: RE | Admit: 2024-12-02 | Discharge: 2024-12-02 | Disposition: A | Discharge status: Home / Self Care | Source: Ambulatory Visit | Attending: Emergency Medicine | Admitting: Emergency Medicine

## 2024-12-02 ENCOUNTER — Encounter (HOSPITAL_COMMUNITY): Payer: Self-pay

## 2024-12-02 VITALS — BP 121/86 | HR 77 | Temp 98.2°F | Resp 16

## 2024-12-02 DIAGNOSIS — N760 Acute vaginitis: Secondary | ICD-10-CM | POA: Diagnosis not present

## 2024-12-02 LAB — POCT URINE PREGNANCY: Preg Test, Ur: NEGATIVE

## 2024-12-02 LAB — HIV ANTIBODY (ROUTINE TESTING W REFLEX): HIV Screen 4th Generation wRfx: REACTIVE — AB

## 2024-12-02 MED ORDER — FLUCONAZOLE 150 MG PO TABS: 150.0000 mg | ORAL_TABLET | Freq: Every day | ORAL | 0 refills | Status: AC

## 2024-12-02 NOTE — Discharge Instructions (Addendum)
 We are checking for sexually transmitted infections including yeast vaginitis and bacterial vaginosis.  You can take the Diflucan  to help with any vaginal itching.  We will contact you if we need to add or change anything in your treatment plan.  Abstain from intercourse until all results have been received.  Return to clinic for any new or urgent symptoms.

## 2024-12-02 NOTE — ED Triage Notes (Signed)
 Pt states that she has vaginal itching X 2 days. She would like STI testing she would like Cyto and blood work today.

## 2024-12-02 NOTE — ED Provider Notes (Signed)
 " [facility]    CSN: [id number] Arrival date & time: 12/02/24  9057      History   Chief Complaint Chief Complaint  Patient presents with   Vaginal Itching    Entered by patient   SEXUALLY TRANSMITTED DISEASE    HPI Katie Mack is a 24 y.o. female.   Patient presents to clinic requesting screening for sexually transmitted infection.  Tested positive for chlamydia in November and completed all antibiotics, partner tested positive for chlamydia as well and they abstain from intercourse until antibiotics were completed No vaginal discharge, but does feel uncomfortable in the vaginal area with some itching Denies abdominal pain or urinary symptoms  Would like HIV and syphilis screening  The history is provided by the patient and medical records.  Vaginal Itching    Past Medical History:  Diagnosis Date   Asthma     Patient Active Problem List   Diagnosis Date Noted   GERD 02/02/2015   Vitamin D  deficiency 07/15/2014   Wears glasses 07/15/2014   Allergic rhinitis 07/15/2014   Mild intermittent asthma 07/15/2014   History of self injurious behavior 03/02/2014   Adjustment disorder with depressed mood 03/02/2014   Pediatric body mass index (BMI) of 85th percentile to less than 95th percentile for age 21/04/2014    Past Surgical History:  Procedure Laterality Date   APPENDECTOMY     TONSILLECTOMY      OB History     Gravida  0   Para  0   Term  0   Preterm  0   AB  0   Living  0      SAB  0   IAB  0   Ectopic  0   Multiple  0   Live Births  0            Home Medications    Prior to Admission medications  Medication Sig Start Date End Date Taking? Authorizing Provider  fluconazole  (DIFLUCAN ) 150 MG tablet Take 1 tablet (150 mg total) by mouth daily. 12/02/24  Yes Laneah Luft  N, FNP  albuterol  (VENTOLIN  HFA) 108 (90 Base) MCG/ACT inhaler 1 puff as needed Inhalation every 4 hrs    [provider]     Family History History reviewed. No pertinent family history.  Social History Social History[1]   Allergies   Patient has no known allergies.   Review of Systems Review of Systems  Per HPI  Physical Exam Triage Vital Signs ED Triage Vitals [12/02/24 1006]  Encounter Vitals Group     BP 121/86     Girls Systolic BP Percentile      Girls Diastolic BP Percentile      Boys Systolic BP Percentile      Boys Diastolic BP Percentile      Pulse Rate 77     Resp 16     Temp 98.2 F (36.8 C)     Temp Source Oral     SpO2 97 %     Weight      Height      Head Circumference      Peak Flow      Pain Score 0     Pain Loc      Pain Education      Exclude from Growth Chart    No data found.  Updated Vital Signs BP 121/86 (BP Location: Right Arm)   Pulse 77   Temp 98.2 F (36.8 C) (Oral)  Resp 16   LMP  (LMP Unknown) Comment: 10/2024 not sure of date.  SpO2 97%   Visual Acuity Right Eye Distance:   Left Eye Distance:   Bilateral Distance:    Right Eye Near:   Left Eye Near:    Bilateral Near:     Physical Exam Vitals and nursing note reviewed.  Constitutional:      Appearance: Normal appearance.  HENT:     Head: Normocephalic and atraumatic.     Right Ear: External ear normal.     Left Ear: External ear normal.     Mouth/Throat:     Mouth: Mucous membranes are moist.  Cardiovascular:     Rate and Rhythm: Normal rate.  Pulmonary:     Effort: Pulmonary effort is normal. No respiratory distress.  Skin:    General: Skin is warm and dry.  Neurological:     General: No focal deficit present.     Mental Status: She is alert.  Psychiatric:        Mood and Affect: Mood normal.      UC Treatments / Results  Labs (all labs ordered are listed, but only abnormal results are displayed) Labs Reviewed  POCT URINE PREGNANCY - Normal  HIV ANTIBODY (ROUTINE TESTING W REFLEX)  SYPHILIS: RPR W/REFLEX TO RPR TITER AND TREPONEMAL ANTIBODIES, TRADITIONAL  SCREENING AND DIAGNOSIS ALGORITHM  CERVICOVAGINAL ANCILLARY ONLY    EKG   Radiology No results found.  Procedures Procedures (including critical care time)  Medications Ordered in UC Medications - No data to display  Initial Impression / Assessment and Plan / UC Course  I have reviewed the triage vital signs and the nursing notes.  Pertinent labs & imaging results that were available during my care of the patient were reviewed by me and considered in my medical decision making (see chart for details).  Vitals and triage reviewed, patient is hemodynamically stable.  Urine pregnancy negative.  Without abdominal pain or fever, low clinical concern for PID.  Some vaginal itching, will trial Diflucan  for yeast vaginitis.  Cytology swab obtained.  HIV and syphilis screening sent.  Several contact if treatment modification is needed.  Plan of care, follow-up care and return precautions given, no questions at this time.    Final Clinical Impressions(s) / UC Diagnoses   Final diagnoses:  Acute vaginitis     Discharge Instructions      We are checking for sexually transmitted infections including yeast vaginitis and bacterial vaginosis.  You can take the Diflucan  to help with any vaginal itching.  We will contact you if we need to add or change anything in your treatment plan.  Abstain from intercourse until all results have been received.  Return to clinic for any new or urgent symptoms.     ED Prescriptions     Medication Sig Dispense Auth. Provider   fluconazole  (DIFLUCAN ) 150 MG tablet Take 1 tablet (150 mg total) by mouth daily. 1 tablet Dreama, Ronson Hagins  N, FNP      PDMP not reviewed this encounter.     [1]  Social History Tobacco Use   Smoking status: Never  Vaping Use   Vaping status: Former  Substance Use Topics   Alcohol use: Yes   Drug use: Never     Dreama Linnette SAILOR, FNP 12/02/24 1029  "

## 2024-12-03 LAB — CERVICOVAGINAL ANCILLARY ONLY
Bacterial Vaginitis (gardnerella): NEGATIVE
Candida Glabrata: NEGATIVE
Candida Vaginitis: NEGATIVE
Chlamydia: NEGATIVE
Comment: NEGATIVE
Comment: NEGATIVE
Comment: NEGATIVE
Comment: NEGATIVE
Comment: NEGATIVE
Comment: NORMAL
Neisseria Gonorrhea: NEGATIVE
Trichomonas: NEGATIVE

## 2024-12-03 LAB — SYPHILIS: RPR W/REFLEX TO RPR TITER AND TREPONEMAL ANTIBODIES, TRADITIONAL SCREENING AND DIAGNOSIS ALGORITHM: RPR Ser Ql: NONREACTIVE

## 2024-12-05 ENCOUNTER — Ambulatory Visit: Payer: Self-pay

## 2024-12-05 LAB — HIV-1/HIV-2 QUALITATIVE RNA
Final Interpretation: NEGATIVE
HIV-1 RNA, Qualitative: NONREACTIVE
HIV-2 RNA, Qualitative: NONREACTIVE

## 2024-12-05 LAB — HIV-1/2 AB - DIFFERENTIATION
HIV 1 Ab: NONREACTIVE
HIV 2 Ab: NONREACTIVE
Note: NEGATIVE

## 2024-12-06 ENCOUNTER — Emergency Department (HOSPITAL_COMMUNITY)
Admission: EM | Admit: 2024-12-06 | Discharge: 2024-12-06 | Disposition: A | Discharge status: Home / Self Care | Attending: Emergency Medicine | Admitting: Emergency Medicine

## 2024-12-06 ENCOUNTER — Other Ambulatory Visit: Payer: Self-pay

## 2024-12-06 ENCOUNTER — Encounter (HOSPITAL_COMMUNITY): Payer: Self-pay

## 2024-12-06 DIAGNOSIS — M546 Pain in thoracic spine: Secondary | ICD-10-CM | POA: Insufficient documentation

## 2024-12-06 DIAGNOSIS — J45909 Unspecified asthma, uncomplicated: Secondary | ICD-10-CM | POA: Diagnosis not present

## 2024-12-06 LAB — URINALYSIS, ROUTINE W REFLEX MICROSCOPIC
Bilirubin Urine: NEGATIVE
Glucose, UA: NEGATIVE mg/dL
Hgb urine dipstick: NEGATIVE
Ketones, ur: NEGATIVE mg/dL
Nitrite: NEGATIVE
Protein, ur: NEGATIVE mg/dL
Specific Gravity, Urine: 1.025 (ref 1.005–1.030)
pH: 5 (ref 5.0–8.0)

## 2024-12-06 LAB — PREGNANCY, URINE: Preg Test, Ur: NEGATIVE

## 2024-12-06 MED ORDER — OXYCODONE-ACETAMINOPHEN 5-325 MG PO TABS: 1.0000 | ORAL_TABLET | Freq: Once | ORAL | Status: DC

## 2024-12-06 MED ORDER — METHYLPREDNISOLONE 4 MG PO TBPK: ORAL_TABLET | ORAL | 0 refills | Status: AC

## 2024-12-06 MED ORDER — KETOROLAC TROMETHAMINE 15 MG/ML IJ SOLN: 15.0000 mg | Freq: Once | INTRAMUSCULAR | Status: DC

## 2024-12-06 MED ORDER — METHOCARBAMOL 500 MG PO TABS: 500.0000 mg | ORAL_TABLET | Freq: Two times a day (BID) | ORAL | 0 refills | Status: AC

## 2024-12-06 MED ORDER — METHOCARBAMOL 500 MG PO TABS
500.0000 mg | ORAL_TABLET | Freq: Once | ORAL | Status: AC
  Administered 2024-12-06: 500 mg via ORAL
  Filled 2024-12-06: qty 1

## 2024-12-06 NOTE — ED Triage Notes (Signed)
 Pt POV with friend d/t lower mid  back pain for 1 week.  Pt states she has had frequency in urination.

## 2024-12-06 NOTE — ED Provider Notes (Signed)
 I saw and evaluated the patient, reviewed the resident's note and I agree with the findings and plan.     24 year old female presents with muscle skeletal back pain x 1 week.  No red flag symptoms.  Neurological exam is stable.  Will treat with symptomatic medications   Katie Faden, MD 12/06/24 1523

## 2024-12-06 NOTE — ED Notes (Signed)
 Pt d/c home per EDP order. Discharge summary reviewed, verbalize understanding. NAD. Reports discharge ride home.

## 2024-12-06 NOTE — Discharge Instructions (Addendum)
 You were seen today for back pain. While you were here we monitored your vitals, preformed a physical exam, and obtained labs. These were all reassuring and there is no indication for any further testing or intervention in the emergency department at this time.   Things to do:  - Follow up with your primary care provider within the next 1-2 weeks - Please pick up both of the prescriptions we prescribed you for your back pain and use them as directed  Return to the emergency department if you have any new or worsening symptoms including bowel or bladder incontinence, difficulty ambulating, numbness or weakness in your extremities, or if you have any other concerns.

## 2024-12-06 NOTE — ED Provider Notes (Signed)
 " [place] [facility] AT [facility] Provider Note   HPI/ROS    History obtained from patient.   Katie Mack is a 24 y.o. female who presents for Back Pain and who  has a past medical history of Asthma. Patient presents today for 1 week of thoracolumbar back pain.  States it is in the upper lumbar/lower thoracic region.  Denies any recent falls or traumas.  States that she just noticed it 1 day and has been pretty consistent since.  Has tried taking ibuprofen at home and doing yoga poses with no relief.  Otherwise has been well and denies any fevers, chills, chest pain, shortness breath, vomiting, or diarrhea.  Denies any dysuria or but does endorse some increased urinary frequency and episodic nausea.  Has not had any emesis associated with this.  Denies any bowel or bladder incontinence, difficulty ambulating, or IV drug use.  Denies saddle anesthesia.  Denies pain being worse at night.  She works as a advertising copywriter.  MDM   I have reviewed the nursing documentation, vital signs, as well as the past medical history, surgical history, family history, and social history.  Initial Assessment:  Patient hemodynamically stable on initial evaluation.  Overall benign physical exam with no neurologic deficits, saddle anesthesia, or other red flag symptoms of back pain.  Back exam overall benign as well with some paraspinal muscle tenderness in the thoracolumbar region without significant tenderness to palpation.  Endorses pain, but does not feel worsening discomfort upon significant palpation.  Patient has no focal neurologic deficit concerning for spinal involvement at this time.    No history of IV drug use, so lower concern for epidural abscess.  No recent trauma or instrumentation, lower concern for epidural hematoma.  No signs of conus medullaris or cauda equina syn.  Drome.  UA obtained in first look with no signs of UTI.  No gross hematuria consistent with  nephrolithiasis, and bilateral back pain being consistent.  No hematuria.  Symmetric pulses with no history of prior aortic aneurysm, so lower concern for aortic aneurysm as a cause of this.  Could possibly be muscle strain.  Given no signs of UTI and patient currently not pregnant feel patient stable for discharge.  No red flag symptoms here.  Discussed taking the easy at home and prescribed a Medrol  Dosepak as well as Robaxin  for ongoing back pain.  Plan to follow-up with her PCP in the next week.  Disposition:  I discussed the plan for discharge with the patient and/or their surrogate at bedside prior to discharge and they were in agreement with the plan and verbalized understanding of the return precautions provided. All questions answered to the best of my ability. Ultimately, the patient was discharged in stable condition with stable vital signs. I am reassured that they are capable of close follow up and good social support at home.    This patient was staffed with Dr. Dasie who supervised the visit and agreed with the plan of care.   Due to the patients current presenting symptoms, physical exam findings, and the workup stated above, it is thought that the etiology of the patients current presentation is:  1. Acute bilateral thoracic back pain     Clinical Complexity A medically appropriate history, review of systems, and physical exam was performed.  Factors that affect the complexity of this encounter: assessment of correct protocol and laboratory work from this visit  My independent interpretations of diagnostic studies are documented in the  ED course above.   If decision rules were used in this patient's evaluation, they are listed below.   Click here for ABCD2, HEART and other calculators  Patient's presentation is most consistent with acute illness / injury with system symptoms.  MDM generated using voice dictation software and may contain dictation errors. Please contact  me for any clarification or with any questions.    Physical Exam, PMH, PSH, Family History, and Social Hsitory   Vitals:   12/06/24 1420 12/06/24 1424  BP:  123/73  Pulse:  80  Resp:  15  Temp:  98.4 F (36.9 C)  TempSrc:  Oral  SpO2:  99%  Weight: 79.4 kg   Height: 5' 2 (1.575 m)     Physical Exam Constitutional:      Appearance: Normal appearance.  HENT:     Head: Normocephalic and atraumatic.  Eyes:     Extraocular Movements: Extraocular movements intact.     Pupils: Pupils are equal, round, and reactive to light.  Cardiovascular:     Rate and Rhythm: Normal rate and regular rhythm.     Pulses:          Radial pulses are 2+ on the right side and 2+ on the left side.  Pulmonary:     Effort: Pulmonary effort is normal.     Breath sounds: Normal breath sounds. No wheezing, rhonchi or rales.  Abdominal:     General: Abdomen is flat.     Palpations: Abdomen is soft.     Tenderness: There is no abdominal tenderness. There is no guarding or rebound.     Comments: Subjective tenderness in the region of the kidneys bilaterally without tenderness to palpation consistent with CVA tenderness, but no tenderness to percussion over the flanks  Musculoskeletal:        General: Normal range of motion.     Cervical back: Normal range of motion. No rigidity or tenderness.       Back:     Right lower leg: No edema.     Left lower leg: No edema.     Comments: No bony tenderness over the midline C, T, or L-spine.  Some bilateral paraspinal muscle tenderness in the right thoracolumbar region.  No obvious step-offs or deformities.  No obvious muscle rigidity or significant tenderness to palpation in this region.  Skin:    General: Skin is warm and dry.     Capillary Refill: Capillary refill takes less than 2 seconds.  Neurological:     General: No focal deficit present.     Mental Status: She is alert and oriented to person, place, and time.     Comments: Full strength and sensation in  bilateral upper and lower extremities.  Full strength on handgrip, flexion extension at the wrist, elbow, and shoulder.  Full strength on flexion/extension of ankle, knee, and hip.  Intact sensation over all extremities.  Psychiatric:        Mood and Affect: Mood normal.        Behavior: Behavior normal.     Past Medical History:  Diagnosis Date   Asthma      Past Surgical History:  Procedure Laterality Date   APPENDECTOMY     TONSILLECTOMY       History reviewed. No pertinent family history.  Social History   Tobacco Use   Smoking status: Never   Smokeless tobacco: Not on file  Substance Use Topics   Alcohol use: Yes     Procedures  If procedures were preformed on this patient, they are listed below:  Procedures   Electronically signed by:   Glendia Carlin Ancona, M.D. PGY-2, Emergency Medicine   Please note that this documentation was produced with the assistance of voice-to-text technology and may contain errors.    Ancona Glendia, MD 12/06/24 [phone]  "
